# Patient Record
Sex: Female | Born: 1985 | Race: Black or African American | Hispanic: No | Marital: Single | Smoking: Never smoker
Health system: Southern US, Community
[De-identification: ages and names within clinical notes are randomized; demographics above are authoritative.]

---

## 2006-02-17 ENCOUNTER — Other Ambulatory Visit: Admission: RE | Admit: 2006-02-17 | Discharge: 2006-02-17 | Payer: Self-pay | Admitting: Obstetrics and Gynecology

## 2006-06-02 ENCOUNTER — Inpatient Hospital Stay (HOSPITAL_COMMUNITY): Admission: AD | Admit: 2006-06-02 | Discharge: 2006-06-02 | Payer: Self-pay | Admitting: Obstetrics and Gynecology

## 2006-08-17 ENCOUNTER — Inpatient Hospital Stay (HOSPITAL_COMMUNITY): Admission: AD | Admit: 2006-08-17 | Discharge: 2006-08-19 | Payer: Self-pay | Admitting: Obstetrics and Gynecology

## 2008-05-30 ENCOUNTER — Inpatient Hospital Stay (HOSPITAL_COMMUNITY): Admission: AD | Admit: 2008-05-30 | Discharge: 2008-05-30 | Payer: Self-pay | Admitting: Obstetrics

## 2008-07-19 ENCOUNTER — Inpatient Hospital Stay (HOSPITAL_COMMUNITY): Admission: AD | Admit: 2008-07-19 | Discharge: 2008-07-21 | Payer: Self-pay | Admitting: Obstetrics

## 2008-07-28 ENCOUNTER — Inpatient Hospital Stay (HOSPITAL_COMMUNITY): Admission: AD | Admit: 2008-07-28 | Discharge: 2008-07-31 | Payer: Self-pay | Admitting: Obstetrics

## 2008-07-28 ENCOUNTER — Encounter (INDEPENDENT_AMBULATORY_CARE_PROVIDER_SITE_OTHER): Payer: Self-pay | Admitting: Obstetrics

## 2010-08-01 ENCOUNTER — Ambulatory Visit (HOSPITAL_COMMUNITY)
Admission: AD | Admit: 2010-08-01 | Discharge: 2010-08-01 | Payer: Self-pay | Source: Home / Self Care | Attending: Obstetrics | Admitting: Obstetrics

## 2010-08-01 ENCOUNTER — Emergency Department (HOSPITAL_COMMUNITY)
Admission: EM | Admit: 2010-08-01 | Discharge: 2010-08-01 | Payer: Self-pay | Source: Home / Self Care | Admitting: Family Medicine

## 2010-08-06 LAB — RH IMMUNE GLOBULIN WORKUP (NOT WOMEN'S HOSP)
ABO/RH(D): B NEG
Antibody Screen: NEGATIVE
Unit division: 0

## 2010-09-23 ENCOUNTER — Inpatient Hospital Stay (HOSPITAL_COMMUNITY)
Admission: AD | Admit: 2010-09-23 | Discharge: 2010-09-24 | Disposition: A | Discharge status: Home / Self Care | Payer: Medicaid Other | Source: Ambulatory Visit | Attending: Obstetrics | Admitting: Obstetrics

## 2010-09-23 ENCOUNTER — Inpatient Hospital Stay (HOSPITAL_COMMUNITY)
Admission: RE | Admit: 2010-09-23 | Discharge: 2010-09-23 | Disposition: A | Discharge status: Home / Self Care | Payer: Medicaid Other | Source: Ambulatory Visit | Attending: Obstetrics | Admitting: Obstetrics

## 2010-09-23 DIAGNOSIS — O4700 False labor before 37 completed weeks of gestation, unspecified trimester: Secondary | ICD-10-CM

## 2010-10-11 ENCOUNTER — Inpatient Hospital Stay (HOSPITAL_COMMUNITY): Payer: Medicaid Other

## 2010-10-11 ENCOUNTER — Inpatient Hospital Stay (HOSPITAL_COMMUNITY)
Admission: AD | Admit: 2010-10-11 | Discharge: 2010-10-14 | DRG: 766 | Disposition: A | Discharge status: Home / Self Care | Payer: Medicaid Other | Source: Ambulatory Visit | Attending: Obstetrics | Admitting: Obstetrics

## 2010-10-11 DIAGNOSIS — Z22330 Carrier of Group B streptococcus: Secondary | ICD-10-CM

## 2010-10-11 DIAGNOSIS — O4290 Premature rupture of membranes, unspecified as to length of time between rupture and onset of labor, unspecified weeks of gestation: Secondary | ICD-10-CM | POA: Diagnosis present

## 2010-10-11 DIAGNOSIS — O99892 Other specified diseases and conditions complicating childbirth: Secondary | ICD-10-CM | POA: Diagnosis present

## 2010-10-11 DIAGNOSIS — O321XX0 Maternal care for breech presentation, not applicable or unspecified: Principal | ICD-10-CM | POA: Diagnosis present

## 2010-10-11 LAB — CBC
HCT: 34.8 % — ABNORMAL LOW (ref 36.0–46.0)
Hemoglobin: 11.7 g/dL — ABNORMAL LOW (ref 12.0–15.0)
MCH: 28.8 pg (ref 26.0–34.0)
MCHC: 33.6 g/dL (ref 30.0–36.0)
MCV: 85.7 fL (ref 78.0–100.0)
Platelets: 208 10*3/uL (ref 150–400)
RBC: 4.06 MIL/uL (ref 3.87–5.11)
RDW: 13 % (ref 11.5–15.5)
WBC: 8.2 10*3/uL (ref 4.0–10.5)

## 2010-10-11 LAB — RPR: RPR Ser Ql: NONREACTIVE

## 2010-10-12 LAB — CBC
HCT: 24.4 % — ABNORMAL LOW (ref 36.0–46.0)
Hemoglobin: 8.2 g/dL — ABNORMAL LOW (ref 12.0–15.0)
MCH: 29.2 pg (ref 26.0–34.0)
MCHC: 33.6 g/dL (ref 30.0–36.0)
MCV: 86.8 fL (ref 78.0–100.0)
Platelets: 163 10*3/uL (ref 150–400)
RBC: 2.81 MIL/uL — ABNORMAL LOW (ref 3.87–5.11)
RDW: 12.9 % (ref 11.5–15.5)
WBC: 9 10*3/uL (ref 4.0–10.5)

## 2010-10-13 LAB — RH IMMUNE GLOB WKUP(>/=20WKS)(NOT WOMEN'S HOSP)
Fetal Screen: NEGATIVE
Unit division: 0

## 2010-10-17 NOTE — H&P (Signed)
  NAMEMISTI, TOWLE              ACCOUNT NO.:  [id number]  MEDICAL RECORD NO.:  [id number]           PATIENT TYPE:  I  LOCATION:  9102                          [facility]  PHYSICIAN:  Kathreen Cosier, M.D.DATE OF BIRTH:  1986-07-04  DATE OF ADMISSION:  10/11/2010 DATE OF DISCHARGE:                             HISTORY & PHYSICAL   The patient is a 25 year old gravida 3, para 1-1-0-2, [facility] October 19, 2010.  Membranes ruptured at 1 a.m. today and came in labor.  By 6:30 a.m., she was 7-8 cm, presenting part -2 to 3 station.  Ultrasound was obtained for presentation and confirmed frank breech, so the patient was delivered by C-section.  PHYSICAL EXAMINATION:  GENERAL:  Revealed a well-developed female, in no distress. HEENT: Negative. LUNGS:  Clear. HEART:  Regular rhythm.  No murmurs or gallops. BREASTS:  No masses. ABDOMEN:  Term size uterus, breech presentation.  Fetal heart rate 140. EXTREMITIES:  Negative.          ______________________________ Kathreen Cosier, M.D.     BAM/MEDQ  D:  10/11/2010  T:  10/12/2010  Job:  [id number]  Electronically Signed by Francoise Ceo M.D. on 10/17/2010 07:29:45 AM

## 2010-10-17 NOTE — Op Note (Signed)
  [name], Carolyn House              ACCOUNT NO.:  [id number]  MEDICAL RECORD NO.:  [id number]           PATIENT TYPE:  I  LOCATION:  9102                          [facility]  PHYSICIAN:  Kathreen Cosier, M.D.DATE OF BIRTH:  Dec 04, 1985  DATE OF PROCEDURE:  10/11/2010 DATE OF DISCHARGE:                              OPERATIVE REPORT   PREOPERATIVE DIAGNOSES:  Intrauterine pregnancy at term, frank breech presentation.  POSTOPERATIVE DIAGNOSES:  Intrauterine pregnancy at term, frank breech presentation.  ANESTHESIA:  Epidural.  SURGEON:  Kathreen Cosier, MD  PROCEDURE:  The patient was placed in the operating table in supine position.  Abdomen was prepped and draped, and the bladder emptied with a Foley catheter.  Transverse suprapubic incision was made, carried down through the rectus fascia.  Fascia cleaned and incised at the length of incision.  The recti muscles were retracted laterally.  Peritoneum was incised longitudinally.  Transverse incision was made in the visceral peritoneum above the bladder.  Bladder mobilized inferiorly.  Transverse lower uterine incision made.  The patient delivered a frank breech female Apgar 8 and 9, weighing 6 pounds 6 ounces.  Placenta was anterior, removed manually and sent to labor and delivery.  Uterine cavity cleaned with dry laps.  The uterine incision closed in 2 layers with continuous suture of #1 chromic.  Bladder flap reattached with 2-0 chromic.  Uterus well contracted.  Tubes and ovaries are normal. Abdomen was closed in layers, peritoneum continuous suture of 0 chromic, fascia continuous suture with Dexon.  Skin closed with subcuticular stitch of 4-0 Monocryl.  The blood loss was 600 mL.  The patient was taken to recovery room in good condition.          ______________________________ Kathreen Cosier, M.D.     BAM/MEDQ  D:  10/11/2010  T:  10/12/2010  Job:  [id number]  Electronically Signed by Francoise Ceo  M.D. on 10/17/2010 07:29:48 AM

## 2010-10-25 NOTE — Discharge Summary (Signed)
  NAMECYAN, CLIPPINGER              ACCOUNT NO.:  [id number]  MEDICAL RECORD NO.:  [id number]           PATIENT TYPE:  I  LOCATION:  9102                          [facility]  PHYSICIAN:  Venia Riveron A. Clearance Coots, M.D.DATE OF BIRTH:  July 25, 1985  DATE OF ADMISSION:  10/11/2010 DATE OF DISCHARGE:  10/14/2010                              DISCHARGE SUMMARY   ADMITTING DIAGNOSES:  Term pregnancy, spontaneous rupture of membranes, active labor, and breech presentation.  DISCHARGE DIAGNOSES:  Term pregnancy, spontaneous rupture of membranes, active labor, and breech presentation, status post primary low transverse cesarean section on October 11, 2010.  Viable female delivered at 12:51, Apgars of 8 at 1 minute and 9 at 5 minutes, weight of 2900 g, length of 48.26 cm.  Mother and infant discharged home in good condition.  REASON FOR ADMISSION:  This is a 25 year old, gravida 3, para 2, estimated date of confinement of October 19, 2010, who presented with rupture of membranes at 1300 and active labor.  On examination, during the course of labor, she was noted to be 7-8 cm dilated and the presenting part at -3 and -2 station and the presenting part could not be determined with certainty on examination.  Ultrasound was obtained for presentation and revealed frank breech presentation.  The patient was therefore taken for cesarean section delivery.  PAST MEDICAL HISTORY:  Please see chart for full historical data.  PHYSICAL EXAMINATION:  GENERAL:  A well-nourished, well-developed female, in no acute distress. VITAL SIGNS:  She is afebrile.  Vital Signs are stable. LUNGS:  Clear to auscultation bilaterally. HEART:  Regular rate and rhythm. ABDOMEN:  Gravid, nontender.  Cervix 6 cm, dilated, 100% effaced, and - 3, -2 station, suspected breech presentation.  ADMITTING LABORATORY DATA:  Hemoglobin 11.7, hematocrit 34.8, white blood cell count 8200, and platelets 208,000.  RPR is  nonreactive.  HOSPITAL COURSE:  The patient was admitted and during the course of labor, the presentation could not be determined with certainty and ultrasound was obtained which revealed a frank breech presentation.  The patient was taken for cesarean section.  Primary low transverse cesarean section was performed on October 11, 2010.  There were no intraoperative complications.  Postoperative course was uncomplicated.  The patient was discharged home on postop day #3 in good condition.  The patient did have anemia postoperatively, but was clinically stable and was able to ambulate without dizziness or headaches or orthostatic blood pressure changes.  DISCHARGE LABORATORY DATA:  Hemoglobin 8.2, hematocrit 24.4, white blood cell count 9000, and platelets 163,000.  DISCHARGE DISPOSITION:  Medications:  Continue prenatal vitamins.  Iron was prescribed for anemia.  Percocet and ibuprofen was prescribed for pain.  Micronor was prescribed for contraception.  Routine written instructions were given for discharge after cesarean section.  The patient is to call Dr. Elsie Stain office for a followup appointment in 2 weeks.     Shreyas Piatkowski A. Clearance Coots, M.D.     CAH/MEDQ  D:  10/14/2010  T:  10/15/2010  Job:  [id number]  Electronically Signed by Coral Ceo M.D. on 10/25/2010 11:18:42 AM

## 2010-11-05 LAB — URINALYSIS, ROUTINE W REFLEX MICROSCOPIC
Bilirubin Urine: NEGATIVE
Glucose, UA: NEGATIVE mg/dL
Hgb urine dipstick: NEGATIVE
Ketones, ur: NEGATIVE mg/dL
Nitrite: NEGATIVE
Protein, ur: NEGATIVE mg/dL
Specific Gravity, Urine: 1.015 (ref 1.005–1.030)
Urobilinogen, UA: 0.2 mg/dL (ref 0.0–1.0)
pH: 6.5 (ref 5.0–8.0)

## 2010-11-05 LAB — CBC
HCT: 38.9 % (ref 36.0–46.0)
HCT: 39.3 % (ref 36.0–46.0)
Hemoglobin: 13.1 g/dL (ref 12.0–15.0)
Hemoglobin: 13.1 g/dL (ref 12.0–15.0)
MCHC: 33.3 g/dL (ref 30.0–36.0)
MCHC: 33.6 g/dL (ref 30.0–36.0)
MCV: 92.9 fL (ref 78.0–100.0)
MCV: 94.3 fL (ref 78.0–100.0)
Platelets: 166 10*3/uL (ref 150–400)
Platelets: 201 10*3/uL (ref 150–400)
RBC: 4.17 MIL/uL (ref 3.87–5.11)
RBC: 4.19 MIL/uL (ref 3.87–5.11)
RDW: 13.6 % (ref 11.5–15.5)
RDW: 13.7 % (ref 11.5–15.5)
WBC: 10.5 10*3/uL (ref 4.0–10.5)
WBC: 7.6 10*3/uL (ref 4.0–10.5)

## 2010-11-05 LAB — LACTATE DEHYDROGENASE
LDH: 167 U/L (ref 94–250)
LDH: 279 U/L — ABNORMAL HIGH (ref 94–250)

## 2010-11-05 LAB — COMPREHENSIVE METABOLIC PANEL
ALT: 15 U/L (ref 0–35)
ALT: 37 U/L — ABNORMAL HIGH (ref 0–35)
AST: 27 U/L (ref 0–37)
AST: 61 U/L — ABNORMAL HIGH (ref 0–37)
Albumin: 2.5 g/dL — ABNORMAL LOW (ref 3.5–5.2)
Albumin: 2.9 g/dL — ABNORMAL LOW (ref 3.5–5.2)
Alkaline Phosphatase: 111 U/L (ref 39–117)
Alkaline Phosphatase: 130 U/L — ABNORMAL HIGH (ref 39–117)
BUN: 4 mg/dL — ABNORMAL LOW (ref 6–23)
BUN: 7 mg/dL (ref 6–23)
CO2: 23 mEq/L (ref 19–32)
CO2: 23 mEq/L (ref 19–32)
Calcium: 7 mg/dL — ABNORMAL LOW (ref 8.4–10.5)
Calcium: 8.9 mg/dL (ref 8.4–10.5)
Chloride: 102 mEq/L (ref 96–112)
Chloride: 99 mEq/L (ref 96–112)
Creatinine, Ser: 0.49 mg/dL (ref 0.4–1.2)
Creatinine, Ser: 0.53 mg/dL (ref 0.4–1.2)
GFR calc Af Amer: >60 mL/min (ref >60)
GFR calc Af Amer: >60 mL/min (ref >60)
GFR calc non Af Amer: >60 mL/min (ref >60)
GFR calc non Af Amer: >60 mL/min (ref >60)
Glucose, Bld: 118 mg/dL — ABNORMAL HIGH (ref 70–99)
Glucose, Bld: 99 mg/dL (ref 70–99)
Potassium: 3.6 mEq/L (ref 3.5–5.1)
Potassium: 4.2 mEq/L (ref 3.5–5.1)
Sodium: 130 mEq/L — ABNORMAL LOW (ref 135–145)
Sodium: 135 mEq/L (ref 135–145)
Total Bilirubin: 0.5 mg/dL (ref 0.3–1.2)
Total Bilirubin: 0.6 mg/dL (ref 0.3–1.2)
Total Protein: 5.3 g/dL — ABNORMAL LOW (ref 6.0–8.3)
Total Protein: 6.1 g/dL (ref 6.0–8.3)

## 2010-11-05 LAB — RH IMMUNE GLOB WKUP(>/=20WKS)(NOT WOMEN'S HOSP): Fetal Screen: NEGATIVE

## 2010-11-05 LAB — URIC ACID
Uric Acid, Serum: 4.9 mg/dL (ref 2.4–7.0)
Uric Acid, Serum: 5.3 mg/dL (ref 2.4–7.0)

## 2010-11-05 LAB — RPR: RPR Ser Ql: NONREACTIVE

## 2010-11-05 LAB — URINE MICROSCOPIC-ADD ON

## 2010-12-07 NOTE — H&P (Signed)
NAMEDENEISE, GETTY              ACCOUNT NO.:  [id number]   MEDICAL RECORD NO.:  [id number]          PATIENT TYPE:  INP   LOCATION:  9169                          [facility]   PHYSICIAN:  Crist Fat. Rivard, M.D. DATE OF BIRTH:  22-Dec-1985   DATE OF ADMISSION:  DATE OF DISCHARGE:                              HISTORY & PHYSICAL   Carolyn House is a 25 year old gravida 1, para 0, at 38-1/7 weeks, who  presented with uterine contractions every 4 minutes for several hours.  The patient has noted contractions all night without any sleep.  She  denies bleeding but has had increased wetness over the last 24 hours  more since last night.  She reports positive fetal movement.  She denies  headache, visual symptoms, or epigastric pain.   Pregnancy is remarkable for:  1. Age 6.  2. Rh negative with patient receiving RhoGAM during her pregnancy.  3. Family history of Down's syndrome.   PRENATAL LABS:  Blood type is B negative, Rh antibody negative, VDRL  nonreactive, rubella titer positive, hepatitis B surface antigen  negative, HIV nonreactive.  Sickle cell test was negative.  GC and  Chlamydia cultures were negative in the first trimester and the third  trimester.  Pap was normal.  Quadruple screen was normal.  Hemoglobin  upon entering into practice was 12.1.  It was 11.2 at 27 weeks.  Glucola  was normal.  Cystic fibrosis testing was negative.  Group B strep  culture was negative at 36 weeks.  EDC of 08/26/2006 was established by  last menstrual period and was in agreement with ultrasound at  approximately 18 weeks.   HISTORY OF PRESENT PREGNANCY:  The patient entered care at approximately  12 weeks.  She had initially received care at the [facility]  Department but then had transferred to [facility] at approximately  12 weeks.  She had been treated for a UTI at approximately 11 weeks at  the Health Department with Macrobid.  The patient had an ultrasound at  19 weeks  showing normal growth, normal anatomy, and cervix was 3.03 cm  long.  She had her Glucola at 27 weeks, which was normal.  Her  hemoglobin was also normal at that time.  She was given a prescription  for Rhophylac, and she did have that in maternity admission subsequent  to that visit.  Cystic fibrosis testing was also done with her Glucola  and was normal.  The rest of the patient's pregnancy progressed without  difficulty.  She had negative GC and Chlamydia and group B strep at 36  weeks.   OBSTETRICAL HISTORY:  The patient is a primigravida.   MEDICAL HISTORY:  She reports the usual childhood illnesses.  She was a  previous condom user.  She has had one UTI in the past.   ALLERGIES:  She has no known medical allergies.   FAMILY HISTORY:  Her maternal grandfather has hypertension.  Her mother  has asthma.  Her father is diabetic on insulin, and her maternal  grandfather is diabetic on insulin.   GENETIC HISTORY:  Remarkable  for maternal cousin who has Down's  syndrome.   SOCIAL HISTORY:  The patient is single.  The father of the baby is  involved and supportive.  His name is Lodema Pilot.  The patient is  [place] [employer].  She has a high school education.  She is employed in Personnel officer.  Her partner also has a high school  education.  He is employed with a moving company.  She has been followed  by the [facility] [place], [place].  She denies  any alcohol, drug, or tobacco use during this pregnancy.   PHYSICAL EXAMINATION:  Blood pressures in maternal admission were  ranging from 129-161 systolic to 95-123 diastolic.  Other vital signs  were stable.  HEENT is within normal limits.  Lungs:  Breath sounds are  clear.  Heart regular rate and rhythm without murmur.  Breasts are soft  and nontender.  Abdomen:  Fundal height is approximately 38 cm.  Estimated fetal weight 7 to 7-1/2 pounds.  Uterine contractions every 3  to 3-1/2  minutes 60 seconds in duration, moderate to strong quality.  Fetal heart rate is reactive with a negative spontaneous CST.  The  patient is noted to be leaking clear fluid, which is positive for fern  testing.  The cervix was 6-7, 100%, vertex -1 -2 station with 4 waters  noted.  Deep tendon reflexes are 2+ without clonus.  There is a trace  edema noted.  The patient's weight today is 148 pounds.   IMPRESSION:  1. Intrauterine pregnancy at 38-1/7 weeks.  2. Active labor.  3. Negative group B strep.  4. Questionable prolonged rupture of membranes with no evidence of      chorioamnionitis.  5. Elevated blood pressure without prior history.   PLAN:  1. Admit to birthing suite pronto with Dr. Estanislado Pandy as attending      physician.  2. Routine certified nurse midwife orders.  3. Will check PIH, labs, and a cath UA.  4. Plan epidural per patient request.  5. Close observation of blood pressure and fetal heart status and      maternal status.      Carolyn House, C.N.M.      Crist Fat Rivard, M.D.  Electronically Signed    VLL/MEDQ  D:  08/17/2006  T:  08/17/2006  Job:  [id number]

## 2011-04-23 LAB — RH IMMUNE GLOBULIN WORKUP (NOT WOMEN'S HOSP)
ABO/RH(D): B NEG
Antibody Screen: NEGATIVE

## 2011-04-26 LAB — COMPREHENSIVE METABOLIC PANEL
ALT: 12 U/L (ref 0–35)
ALT: 13 U/L (ref 0–35)
AST: 21 U/L (ref 0–37)
Albumin: 2.9 g/dL — ABNORMAL LOW (ref 3.5–5.2)
Alkaline Phosphatase: 114 U/L (ref 39–117)
Alkaline Phosphatase: 128 U/L — ABNORMAL HIGH (ref 39–117)
BUN: 7 mg/dL (ref 6–23)
BUN: 8 mg/dL (ref 6–23)
CO2: 21 mEq/L (ref 19–32)
CO2: 23 mEq/L (ref 19–32)
Calcium: 8.8 mg/dL (ref 8.4–10.5)
Chloride: 104 mEq/L (ref 96–112)
Creatinine, Ser: 0.65 mg/dL (ref 0.4–1.2)
GFR calc Af Amer: >60 mL/min (ref >60)
GFR calc non Af Amer: >60 mL/min (ref >60)
GFR calc non Af Amer: >60 mL/min (ref >60)
Glucose, Bld: 104 mg/dL — ABNORMAL HIGH (ref 70–99)
Glucose, Bld: 92 mg/dL (ref 70–99)
Potassium: 3.5 mEq/L (ref 3.5–5.1)
Potassium: 3.8 mEq/L (ref 3.5–5.1)
Sodium: 133 mEq/L — ABNORMAL LOW (ref 135–145)
Sodium: 136 mEq/L (ref 135–145)
Total Bilirubin: 0.4 mg/dL (ref 0.3–1.2)
Total Bilirubin: 0.6 mg/dL (ref 0.3–1.2)
Total Protein: 5.8 g/dL — ABNORMAL LOW (ref 6.0–8.3)
Total Protein: 6.2 g/dL (ref 6.0–8.3)

## 2011-04-26 LAB — CBC
HCT: 36.9 % (ref 36.0–46.0)
HCT: 38.4 % (ref 36.0–46.0)
Hemoglobin: 12.6 g/dL (ref 12.0–15.0)
Hemoglobin: 13.1 g/dL (ref 12.0–15.0)
MCHC: 34 g/dL (ref 30.0–36.0)
MCHC: 34.2 g/dL (ref 30.0–36.0)
MCV: 93 fL (ref 78.0–100.0)
Platelets: 174 10*3/uL (ref 150–400)
RBC: 3.96 MIL/uL (ref 3.87–5.11)
RBC: 4.13 MIL/uL (ref 3.87–5.11)
RDW: 13.4 % (ref 11.5–15.5)
RDW: 13.5 % (ref 11.5–15.5)
WBC: 7.9 10*3/uL (ref 4.0–10.5)

## 2011-04-26 LAB — CREATININE CLEARANCE, URINE, 24 HOUR
Collection Interval-CRCL: 24 hours
Creatinine Clearance: 140 mL/min — ABNORMAL HIGH (ref 75–115)
Creatinine, 24H Ur: 1307 mg/d (ref 700–1800)
Creatinine, Urine: 186.7 mg/dL
Creatinine: 0.65 mg/dL (ref 0.40–1.20)
Urine Total Volume-CRCL: 700 mL

## 2011-04-26 LAB — PROTEIN, URINE, 24 HOUR
Collection Interval-UPROT: 24 hours
Protein, 24H Urine: 196 mg/d — ABNORMAL HIGH (ref 50–100)
Protein, Urine: 28 mg/dL
Urine Total Volume-UPROT: 700 mL

## 2011-04-26 LAB — LACTATE DEHYDROGENASE
LDH: 120 U/L (ref 94–250)
LDH: 151 U/L (ref 94–250)

## 2011-04-26 LAB — URIC ACID
Uric Acid, Serum: 4.3 mg/dL (ref 2.4–7.0)
Uric Acid, Serum: 4.7 mg/dL (ref 2.4–7.0)

## 2011-10-19 ENCOUNTER — Other Ambulatory Visit: Payer: Self-pay | Admitting: Obstetrics

## 2014-12-14 ENCOUNTER — Encounter (HOSPITAL_COMMUNITY): Payer: Self-pay | Admitting: Emergency Medicine

## 2014-12-14 ENCOUNTER — Emergency Department (INDEPENDENT_AMBULATORY_CARE_PROVIDER_SITE_OTHER)
Admission: EM | Admit: 2014-12-14 | Discharge: 2014-12-14 | Disposition: A | Discharge status: Home / Self Care | Payer: Self-pay | Source: Home / Self Care | Attending: Family Medicine | Admitting: Family Medicine

## 2014-12-14 DIAGNOSIS — S29012A Strain of muscle and tendon of back wall of thorax, initial encounter: Secondary | ICD-10-CM

## 2014-12-14 DIAGNOSIS — J069 Acute upper respiratory infection, unspecified: Secondary | ICD-10-CM

## 2014-12-14 LAB — POCT RAPID STREP A: Streptococcus, Group A Screen (Direct): NEGATIVE

## 2014-12-14 MED ORDER — CYCLOBENZAPRINE HCL 5 MG PO TABS: 5.0000 mg | ORAL_TABLET | Freq: Every evening | ORAL | Status: AC | PRN

## 2014-12-14 MED ORDER — NAPROXEN 375 MG PO TABS: 375.0000 mg | ORAL_TABLET | Freq: Two times a day (BID) | ORAL | Status: AC

## 2014-12-14 NOTE — ED Notes (Addendum)
C/o cold sx States she has a cough, sore throat and chills which started yesterday Ibuprofen was used as tx States she has lower back pain Does sit down at work Tylenol used as tx Denies any injury

## 2014-12-14 NOTE — Discharge Instructions (Signed)
Thank you for coming in today. Call or go to the emergency room if you get worse, have trouble breathing, have chest pains, or palpitations.  Come back or go to the emergency room if you notice new weakness new numbness problems walking or bowel or bladder problems.   Back Exercises These exercises may help you when beginning to rehabilitate your injury. Your symptoms may resolve with or without further involvement from your physician, physical therapist or athletic trainer. While completing these exercises, remember:   Restoring tissue flexibility helps normal motion to return to the joints. This allows healthier, less painful movement and activity.  An effective stretch should be held for at least 30 seconds.  A stretch should never be painful. You should only feel a gentle lengthening or release in the stretched tissue. STRETCH - Extension, Prone on Elbows   Lie on your stomach on the floor, a bed will be too soft. Place your palms about shoulder width apart and at the height of your head.  Place your elbows under your shoulders. If this is too painful, stack pillows under your chest.  Allow your body to relax so that your hips drop lower and make contact more completely with the floor.  Hold this position for __________ seconds.  Slowly return to lying flat on the floor. Repeat __________ times. Complete this exercise __________ times per day.  RANGE OF MOTION - Extension, Prone Press Ups   Lie on your stomach on the floor, a bed will be too soft. Place your palms about shoulder width apart and at the height of your head.  Keeping your back as relaxed as possible, slowly straighten your elbows while keeping your hips on the floor. You may adjust the placement of your hands to maximize your comfort. As you gain motion, your hands will come more underneath your shoulders.  Hold this position __________ seconds.  Slowly return to lying flat on the floor. Repeat __________ times.  Complete this exercise __________ times per day.  RANGE OF MOTION- Quadruped, Neutral Spine   Assume a hands and knees position on a firm surface. Keep your hands under your shoulders and your knees under your hips. You may place padding under your knees for comfort.  Drop your head and point your tail bone toward the ground below you. This will round out your low back like an angry cat. Hold this position for __________ seconds.  Slowly lift your head and release your tail bone so that your back sags into a large arch, like an old horse.  Hold this position for __________ seconds.  Repeat this until you feel limber in your low back.  Now, find your "sweet spot." This will be the most comfortable position somewhere between the two previous positions. This is your neutral spine. Once you have found this position, tense your stomach muscles to support your low back.  Hold this position for __________ seconds. Repeat __________ times. Complete this exercise __________ times per day.  STRETCH - Flexion, Single Knee to Chest   Lie on a firm bed or floor with both legs extended in front of you.  Keeping one leg in contact with the floor, bring your opposite knee to your chest. Hold your leg in place by either grabbing behind your thigh or at your knee.  Pull until you feel a gentle stretch in your low back. Hold __________ seconds.  Slowly release your grasp and repeat the exercise with the opposite side. Repeat __________ times. Complete this  exercise __________ times per day.  STRETCH - Hamstrings, Standing  Stand or sit and extend your right / left leg, placing your foot on a chair or foot stool  Keeping a slight arch in your low back and your hips straight forward.  Lead with your chest and lean forward at the waist until you feel a gentle stretch in the back of your right / left knee or thigh. (When done correctly, this exercise requires leaning only a small distance.)  Hold this  position for __________ seconds. Repeat __________ times. Complete this stretch __________ times per day. STRENGTHENING - Deep Abdominals, Pelvic Tilt   Lie on a firm bed or floor. Keeping your legs in front of you, bend your knees so they are both pointed toward the ceiling and your feet are flat on the floor.  Tense your lower abdominal muscles to press your low back into the floor. This motion will rotate your pelvis so that your tail bone is scooping upwards rather than pointing at your feet or into the floor.  With a gentle tension and even breathing, hold this position for __________ seconds. Repeat __________ times. Complete this exercise __________ times per day.  STRENGTHENING - Abdominals, Crunches   Lie on a firm bed or floor. Keeping your legs in front of you, bend your knees so they are both pointed toward the ceiling and your feet are flat on the floor. Cross your arms over your chest.  Slightly tip your chin down without bending your neck.  Tense your abdominals and slowly lift your trunk high enough to just clear your shoulder blades. Lifting higher can put excessive stress on the low back and does not further strengthen your abdominal muscles.  Control your return to the starting position. Repeat __________ times. Complete this exercise __________ times per day.  STRENGTHENING - Quadruped, Opposite UE/LE Lift   Assume a hands and knees position on a firm surface. Keep your hands under your shoulders and your knees under your hips. You may place padding under your knees for comfort.  Find your neutral spine and gently tense your abdominal muscles so that you can maintain this position. Your shoulders and hips should form a rectangle that is parallel with the floor and is not twisted.  Keeping your trunk steady, lift your right hand no higher than your shoulder and then your left leg no higher than your hip. Make sure you are not holding your breath. Hold this position  __________ seconds.  Continuing to keep your abdominal muscles tense and your back steady, slowly return to your starting position. Repeat with the opposite arm and leg. Repeat __________ times. Complete this exercise __________ times per day. Document Released: 07/26/2005 Document Revised: 09/30/2011 Document Reviewed: 10/20/2008 [facility] Patient Information 2015 [place], [place]. This information is not intended to replace advice given to you by your health care provider. Make sure you discuss any questions you have with your health care provider.

## 2014-12-14 NOTE — ED Provider Notes (Signed)
Carolyn House is a 29 y.o. female who presents to Urgent Care today for chills body ache congestion starting yesterday. Patient also notes 2 weeks of thoracic back pain. Pain is worse with arm activity. No radiating pain weakness or numbness fevers or chills. He is tried ibuprofen and Tylenol which help a little.   History reviewed. No pertinent past medical history. No past surgical history on file. History  Substance Use Topics  . Smoking status: Not on file  . Smokeless tobacco: Not on file  . Alcohol Use: Not on file   ROS as above Medications: No current facility-administered medications for this encounter.   Current Outpatient Prescriptions  Medication Sig Dispense Refill  . cyclobenzaprine (FLEXERIL) 5 MG tablet Take 1 tablet (5 mg total) by mouth at bedtime as needed for muscle spasms. 20 tablet 0  . naproxen (NAPROSYN) 375 MG tablet Take 1 tablet (375 mg total) by mouth 2 (two) times daily. 20 tablet 0   No Known Allergies   Exam:  BP 132/92 mmHg  Pulse 91  Temp(Src) 98.3 F (36.8 C) (Oral)  Resp 16  SpO2 100%  LMP 12/07/2014 Gen: Well NAD HEENT: EOMI,  MMM clear nasal discharge normal posterior pharynx and tympanic membranes Lungs: Normal work of breathing. CTABL Heart: RRR no MRG Abd: NABS, Soft. Nondistended, Nontender Exts: Brisk capillary refill, warm and well perfused.  Back: Nontender to spinal midline. Tender palpation right rhomboid. Pain with scapular motion of the right. Upper extremity strength is intact and equal bilaterally.  Results for orders placed or performed during the hospital encounter of 12/14/14 (from the past 24 hour(s))  POCT rapid strep A [facility])     Status: None   Collection Time: 12/14/14 12:34 PM  Result Value Ref Range   Streptococcus, Group A Screen (Direct) NEGATIVE NEGATIVE   No results found.  Assessment and Plan: 29 y.o. female with viral URI and rhomboid strain. Treat with Flexeril and naproxen. Follow up with  sports medicine as needed. Work note provided. .  Discussed warning signs or symptoms. Please see discharge instructions. Patient expresses understanding.     Rodolph BongEvan S Arielis Leonhart, MD 12/14/14 1245

## 2014-12-16 LAB — CULTURE, GROUP A STREP: STREP A CULTURE: NEGATIVE

## 2015-05-11 ENCOUNTER — Emergency Department (HOSPITAL_BASED_OUTPATIENT_CLINIC_OR_DEPARTMENT_OTHER)
Admission: EM | Admit: 2015-05-11 | Discharge: 2015-05-11 | Disposition: A | Discharge status: Home / Self Care | Payer: Medicaid Other | Attending: Emergency Medicine | Admitting: Emergency Medicine

## 2015-05-11 ENCOUNTER — Encounter (HOSPITAL_BASED_OUTPATIENT_CLINIC_OR_DEPARTMENT_OTHER): Payer: Self-pay | Admitting: *Deleted

## 2015-05-11 DIAGNOSIS — Z791 Long term (current) use of non-steroidal anti-inflammatories (NSAID): Secondary | ICD-10-CM | POA: Insufficient documentation

## 2015-05-11 DIAGNOSIS — G8929 Other chronic pain: Secondary | ICD-10-CM

## 2015-05-11 DIAGNOSIS — R1031 Right lower quadrant pain: Secondary | ICD-10-CM | POA: Insufficient documentation

## 2015-05-11 DIAGNOSIS — Z9889 Other specified postprocedural states: Secondary | ICD-10-CM | POA: Insufficient documentation

## 2015-05-11 DIAGNOSIS — Z3202 Encounter for pregnancy test, result negative: Secondary | ICD-10-CM | POA: Insufficient documentation

## 2015-05-11 LAB — CBC WITH DIFFERENTIAL/PLATELET
BASOS PCT: 1 %
Basophils Absolute: 0 10*3/uL (ref 0.0–0.1)
EOS ABS: 0.1 10*3/uL (ref 0.0–0.7)
Eosinophils Relative: 1 %
HCT: 38.2 % (ref 36.0–46.0)
HEMOGLOBIN: 12.8 g/dL (ref 12.0–15.0)
Lymphocytes Relative: 42 %
Lymphs Abs: 3 10*3/uL (ref 0.7–4.0)
MCH: 29 pg (ref 26.0–34.0)
MCHC: 33.5 g/dL (ref 30.0–36.0)
MCV: 86.6 fL (ref 78.0–100.0)
Monocytes Absolute: 0.5 10*3/uL (ref 0.1–1.0)
Monocytes Relative: 7 %
NEUTROS PCT: 49 %
Neutro Abs: 3.5 10*3/uL (ref 1.7–7.7)
Platelets: 310 10*3/uL (ref 150–400)
RBC: 4.41 MIL/uL (ref 3.87–5.11)
RDW: 12.9 % (ref 11.5–15.5)
WBC: 7.1 10*3/uL (ref 4.0–10.5)

## 2015-05-11 LAB — WET PREP, GENITAL
TRICH WET PREP: NONE SEEN
YEAST WET PREP: NONE SEEN

## 2015-05-11 LAB — URINALYSIS, ROUTINE W REFLEX MICROSCOPIC
Bilirubin Urine: NEGATIVE
Glucose, UA: NEGATIVE mg/dL
Hgb urine dipstick: NEGATIVE
KETONES UR: NEGATIVE mg/dL
LEUKOCYTES UA: NEGATIVE
NITRITE: NEGATIVE
PROTEIN: NEGATIVE mg/dL
Specific Gravity, Urine: 1.023 (ref 1.005–1.030)
UROBILINOGEN UA: 1 mg/dL (ref 0.0–1.0)
pH: 7 (ref 5.0–8.0)

## 2015-05-11 LAB — COMPREHENSIVE METABOLIC PANEL
ALBUMIN: 4.3 g/dL (ref 3.5–5.0)
ALK PHOS: 46 U/L (ref 38–126)
ALT: 10 U/L — AB (ref 14–54)
ANION GAP: 5 (ref 5–15)
AST: 18 U/L (ref 15–41)
BUN: 10 mg/dL (ref 6–20)
CALCIUM: 8.8 mg/dL — AB (ref 8.9–10.3)
CHLORIDE: 102 mmol/L (ref 101–111)
CO2: 27 mmol/L (ref 22–32)
CREATININE: 0.69 mg/dL (ref 0.44–1.00)
GFR calc Af Amer: >60 mL/min (ref >60)
GFR calc non Af Amer: >60 mL/min (ref >60)
GLUCOSE: 101 mg/dL — AB (ref 65–99)
Potassium: 3.7 mmol/L (ref 3.5–5.1)
SODIUM: 134 mmol/L — AB (ref 135–145)
Total Bilirubin: 0.7 mg/dL (ref 0.3–1.2)
Total Protein: 7.4 g/dL (ref 6.5–8.1)

## 2015-05-11 LAB — PREGNANCY, URINE: PREG TEST UR: NEGATIVE

## 2015-05-11 LAB — LIPASE, BLOOD: LIPASE: 30 U/L (ref 11–51)

## 2015-05-11 NOTE — Discharge Instructions (Signed)
For pain control please take ibuprofen (also known as Motrin or Advil) 800mg (this is normally 4 over the counter pills) 3 times a day  for 5 days. Take with food to minimize stomach irritation. ° °Do not hesitate to return to the emergency room for any new, worsening or concerning symptoms. ° °Please obtain primary care using resource guide below. Let them know that you were seen in the emergency room and that they will need to obtain records for further outpatient management. ° ° ° °Emergency Department Resource Guide °1) Find a Doctor and Pay Out of Pocket °Although you won't have to find out who is covered by your insurance plan, it is a good idea to ask around and get recommendations. You will then need to call the office and see if the doctor you have chosen will accept you as a new patient and what types of options they offer for patients who are self-pay. Some doctors offer discounts or will set up payment plans for their patients who do not have insurance, but you will need to ask so you aren't surprised when you get to your appointment. ° °2) Contact Your Local Health Department °Not all health departments have doctors that can see patients for sick visits, but many do, so it is worth a call to see if yours does. If you don't know where your local health department is, you can check in your phone book. The CDC also has a tool to help you locate your state's health department, and many state websites also have listings of all of their local health departments. ° °3) Find a Walk-in Clinic °If your illness is not likely to be very severe or complicated, you may want to try a walk in clinic. These are popping up all over the country in pharmacies, drugstores, and shopping centers. They're usually staffed by nurse practitioners or physician assistants that have been trained to treat common illnesses and complaints. They're usually fairly quick and inexpensive. However, if you have serious medical issues or  chronic medical problems, these are probably not your best option. ° °No Primary Care Doctor: °- Call [facility] at  832-8000 - they can help you locate a primary care doctor that  accepts your insurance, provides certain services, etc. °- Physician Referral Service- 1-800-533-3463 ° °Chronic Pain Problems: °Organization         Address  Phone   Notes  °[facility]  (336) 297-2271 Patients need to be referred by their primary care doctor.  ° °Medication Assistance: °Organization         Address  Phone   Notes  °[place] [facility] [address] °[place], [place] [address] (336) 641-8030 --Must be a resident of [place] °-- Must have NO insurance coverage whatsoever (no Medicaid/ Medicare, etc.) °-- The pt. MUST have a primary care doctor that directs their care regularly and follows them in the community °  °[facility]  (866) 331-1348   °[employer]  (888) 892-1162   ° °Agencies that provide inexpensive medical care: °Organization         Address  Phone   Notes  °[facility]  (336) 832-8035   °[facility]    (336) 832-7272   °[facility] [address] °[place], [place] [address] (336) 832-4777   °[facility] [address], °[place] (336) 271-4999   °[facility]    (336) 373-0678   °  [facility]    (336) 272-1050   °[facility] ° [address], [place] Phone:  (336) 832-4444, Fax:  (336) 832-4440 Hours of Operation:  9 am - 6 pm, M-F.  Also accepts Medicaid/Medicare and self-pay.  °[facility] ° [address], [place] Phone: (336) 832-3150, Fax: (336) 832-3151. Hours of Operation:  8:30 am - 5:30 pm, M-F.  Also accepts Medicaid and self-pay.  °[facility] [place] [address], [place] Phone: (336) 878-6027   °[facility] [address], [place], [place]  (336)723-1848, Ext. 123 Mondays & Thursdays: 7-9 AM.  First 15 patients are seen on a first come, first serve basis. °  ° °Medicaid-accepting [place] Providers: ° °Organization         Address  Phone   Notes  °[facility] [address], [place] (336) 641-2100 Also accepts self-pay patients.  °[facility] [address], [place] ° (336) 856-9996   °[facility] [address], [place] (336) 288-8857   °[facility] [address] [place] [address], [place] (336) 299-7000   °Veita Bland [address], [place]  ° (336) 373-1557 Only accepts [place] Access Medicaid patients after they have their name applied to their card.  ° °Self-Pay (no insurance) in [place]: ° °Organization         Address  Phone   Notes  °Sickle Cell Patients, [facility] [address], [place] (336) 832-1970   °[facility] [address], [place] (336) 832-4400   °[facility] ° [address], [place] (336) 992-4800   °[facility]/Dr. Osei-Bonsu ° [address] [place] [address], [place] or [address], [place] (336) 841-8500 Phone number for both [place] and [place] locations is the same.  °[facility] [address], [place] (336) 299-0000   °[facility] [address] [place] [address], [place] or [address] (336) 852-7530 °(336) 878-2260   °[facility] [address], [place] (336) 350-1642, phone; (336) 294-5005, fax Sees patients 1st and 3rd Saturday of every month.  Must not qualify for public or private insurance (i.e. Medicaid, Medicare, [place] Health Choice, Veterans' Benefits) • Household income should be no more than 200% of the poverty level •The clinic cannot treat you if you are pregnant or think you are pregnant • Sexually transmitted  diseases are not treated at the clinic.  ° ° °Dental Care: °Organization         Address  Phone  Notes  °[facility] [facility] [address],  (336) 641-6152 Accepts children up to age 21 who are enrolled in Medicaid or [place] Health Choice; pregnant women with a Medicaid card; and children who have applied for Medicaid or [place] Health Choice, but were declined, whose parents can pay a reduced fee at time of service.  °[facility] [place]  [address], [place] (336) 641-7733 Accepts children up to age 21 who are enrolled in Medicaid or [place] Health Choice; pregnant women with a Medicaid card; and children who have applied for Medicaid or [place] Health Choice, but were declined, whose parents can pay a reduced fee at time   of service.  °[facility] ° [address], [place] (336) 641-4533 Patients are seen by appointment only. Walk-ins are not accepted. [facility] will see patients 18 years of age and older. °Monday - Tuesday (8am-5pm) °Most Wednesdays (8:30-5pm) °$30 per visit, cash only  °[facility] ° [address], [place] (336) 641-4533 Patients are seen by appointment only. Walk-ins are not accepted. [facility] will see patients 18 years of age and older. °One Wednesday Evening (Monthly: Volunteer Based).  $30 per visit, cash only  °[facility]  (919) 537-3737 for adults; Children under age 4, call Graduate Pediatric Dentistry at (919) [phone]. Children aged 4-14, please call (919) [phone] to request a pediatric application. ° Dental services are provided in all areas of dental care including fillings, crowns and bridges, complete and partial dentures, implants, gum treatment, root canals, and extractions. Preventive care is also provided. Treatment is provided to both adults and children. °Patients are selected via a  lottery and there is often a waiting list. °  °[facility] [address], °[place] ° (336) 763-8833 [email or web address] °  °[facility] [address], [place], [place] (336)723-1848, Ext. 123 Second and Fourth Thursday of each month, opens at 6:30 AM; Clinic ends at 9 AM.  Patients are seen on a first-come first-served basis, and a limited number are seen during each clinic.  ° °[facility] ° [address], [place], [place] (336) 723-7904   Eligibility Requirements °You must have lived in [place], [place], or [place] counties for at least the last three months. °  You cannot be eligible for state or federal sponsored healthcare insurance, including Veterans Administration, Medicaid, or Medicare. °  You generally cannot be eligible for healthcare insurance through your employer.  °  How to apply: °Eligibility screenings are held every Tuesday and Wednesday afternoon from 1:00 pm until 4:00 pm. You do not need an appointment for the interview!  °[facility] [address], [place], [place] 336-631-2330   °[facility] [facility]  336-342-8273   °[facility]  336-703-3100   °[facility]  336-570-6415   ° °Behavioral Health Resources in the Community: °Intensive Outpatient Programs °Organization         Address  Phone  Notes  °[facility] [address], [place], [place] 336-878-6098   °[facility] [address], [place], [place] 336-832-9800   °[facility] [address], [place], [place] ° 336-882-2125   °[facility] [address],  °[place],  1-800-853-5163 or 336-641-4981   °Substance Abuse Resources °Organization         Address  Phone  Notes  °[facility]  336-882-2125   °[facility]  336-784-9470   °[facility]  336-285-9073   °[facility]  336-845-3988   °Residential &  Outpatient Substance Abuse Program  1-800-659-3381   °Psychological Services °Organization         Address  Phone  Notes  ° Health  336- 832-9600   °[facility]  336- 378-7881   °[facility] [address], [place] 1-800-853-5163 or 336-641-4981   ° °Mobile Crisis Teams °Organization         Address  Phone  Notes  °[facility], Mobile Crisis Care Unit  1-877-626-1772   °Assertive °Psychotherapeutic   Services ° [address] [place], [place] 336-834-9664   °Sharon DeEsch [address] °[place] 336-554-5454   ° °Self-Help/Support Groups °Organization         Address  Phone             Notes  °[facility] [place] - variety of support groups  336- 373-1402 Call for more information  °Narcotics Anonymous (NA), [facility] [address], °[place]  2 meetings at this location  ° °Residential Treatment Programs °Organization         Address  Phone  Notes  °[facility] [address],    °[place]  1-866-801-8205   °[facility] ° [address], [place], [place] 704-293-8524   °[facility] [address], [place] 336-845-3988 Admissions: 8am-3pm M-F  °[facility] [address]    °[place], [place] 336-841-1104   °[facility] [address], [place], [place] 336-379-7146   °[facility] [address]  °[place], [place] 336-285-9073   °[facility] Intensive Outpatient [address], [place], [place] 336-852-3033   °[facility] [address]  °[place], [place] 1-877-615-2722 or 336-784-9470   °[facility] [address] [place], [place] 336-227-7417 Accepts Medicaid  °[facility] [address]  °[place] 1-800-659-3381 Substance Abuse/Addiction Treatment  ° °[place] Behavioral Health Resources °Organization          Address  Phone  Notes  °[facility]  (888) 581-9988   °Julie Brannon, PhD [address] [place], [place]   (336) 349-5553 or (336) 951-0000   °[facility]   [address] °[place], [place] (336) 349-4454   °[facility] [address], [place], [place] (336) 342-8316 Insurance/Medicaid/sponsorship through [facility]  °[facility] [address]                                    [place], [place] (336) 342-8316 Therapy/tele-psych/case  °[facility] [address]  ° [place], [place] (336) 349-2233    °Dr. Arfeen  (336) 349-4544   °[facility] [place]  [employer] [place] [facility] 1) [address], [place] °2) [address], [place] °3)  [address], [place] (336) 349-3220 °(336) 342-7768 ° °(336) 342-8140   °[place] [facility] (336) 342-1394 or (336) 342-3537 (After Hours)    ° ° ° °

## 2015-05-11 NOTE — ED Provider Notes (Signed)
CSN: [id number]     Arrival date & time 05/11/15  1745 History   First MD Initiated Contact with Patient 05/11/15 1749     Chief Complaint  Patient presents with  . Abdominal Pain     (Consider location/radiation/quality/duration/timing/severity/associated sxs/prior Treatment) HPI   Blood pressure 151/91, pulse 86, temperature 98.3 F (36.8 C), temperature source Oral, resp. rate 18, height 5' (1.524 m), weight 130 lb (58.968 kg), last menstrual period 04/29/2015, SpO2 100 %.  Carolyn BologneseBrittanie R Logsdon is a 29 y.o. female with no significant medical history presents today with abdominal pain. She states that the pain has been present for months but got worse earlier today and is located in the right lower quadrant. The pain is constant and dull but was more severe today. Denies radiation or alleviating or aggravating factors. She reports some associated nausea and blood in the stool. She states that she has noticed blood in the stool and in the toilet after having a bowel movement. Reports constipation this week and increased stool frequency a few weeks ago. LMP was 04/29/15. Denies dysuria, hematuria, vaginal itching, discharge, fever, chills, SOB, chest pain, or cough. She states that she has been feeling slightly light headed in the morning when she wakes up.    History reviewed. No pertinent past medical history. Past Surgical History  Procedure Laterality Date  . Cesarean section     No family history on file. Social History  Substance Use Topics  . Smoking status: Never Smoker   . Smokeless tobacco: None  . Alcohol Use: No   OB History    No data available     Review of Systems  10 systems reviewed and found to be negative, except as noted in the HPI.   Allergies  Review of patient's allergies indicates no known allergies.  Home Medications   Prior to Admission medications   Medication Sig Start Date End Date Taking? Authorizing Provider  cyclobenzaprine (FLEXERIL) 5 MG  tablet Take 1 tablet (5 mg total) by mouth at bedtime as needed for muscle spasms. 12/14/14   Rodolph BongEvan S Corey, MD  naproxen (NAPROSYN) 375 MG tablet Take 1 tablet (375 mg total) by mouth 2 (two) times daily. 12/14/14   Rodolph BongEvan S Corey, MD   BP 144/99 mmHg  Pulse 95  Temp(Src) 98.1 F (36.7 C) (Oral)  Resp 18  Ht 5' (1.524 m)  Wt 130 lb (58.968 kg)  BMI 25.39 kg/m2  SpO2 99%  LMP 04/29/2015 Physical Exam  Constitutional: She is oriented to person, place, and time. She appears well-developed and well-nourished. No distress.  HENT:  Head: Normocephalic.  Eyes: Conjunctivae and EOM are normal.  Cardiovascular: Normal rate.   Pulmonary/Chest: Effort normal and breath sounds normal. No stridor. No respiratory distress. She has no wheezes. She has no rales. She exhibits no tenderness.  Abdominal: Soft. Bowel sounds are normal. She exhibits no distension and no mass. There is no tenderness. There is no rebound and no guarding.    Murphy sign negative, no tenderness to palpation over McBurney's point, Rovsings, Psoas and obturator all negative.   Genitourinary:  Supervised PA student [facility] who performs pelvic exam: No rashes or lesion, no abnormal vaginal discharge, no cervical motion or adnexal tenderness.  Musculoskeletal: Normal range of motion. She exhibits no edema or tenderness.  Neurological: She is alert and oriented to person, place, and time.  Psychiatric: She has a normal mood and affect.  Nursing note and vitals reviewed.   ED Course  Procedures (including critical care time) Labs Review Labs Reviewed  URINALYSIS, ROUTINE W REFLEX MICROSCOPIC (NOT AT [facility])  PREGNANCY, URINE    Imaging Review No results found. I have personally reviewed and evaluated these images and lab results as part of my medical decision-making.   EKG Interpretation None      MDM   Final diagnoses:  Chronic RLQ pain    Filed Vitals:   05/11/15 1750  BP: 144/99  Pulse: 95  Temp: 98.1  F (36.7 C)  TempSrc: Oral  Resp: 18  Height: 5' (1.524 m)  Weight: 130 lb (58.968 kg)  SpO2: 99%    Carolyn House is 29 y.o. female presenting with exacerbation of right lower quadrant pain which she's had for several months. Abdominal exam benign. No systemic signs of infection, no GI or GU complaints. Pelvic exam unremarkable, blood work, wrapped prep, urine unremarkable. Think this may be related to ovarian cysts. Patient given referral to women's hospital. Extensive discussion of return precautions.  Evaluation does not show pathology that would require ongoing emergent intervention or inpatient treatment. Pt is hemodynamically stable and mentating appropriately. Discussed findings and plan with patient/guardian, who agrees with care plan. All questions answered. Return precautions discussed and outpatient follow up given.         Wynetta Emery, PA-C 05/11/15 2042  Geoffery Lyons, MD 05/11/15 [phone]

## 2015-05-11 NOTE — ED Notes (Signed)
Pain in her abdominal for a few months but worse yesterday and today.

## 2015-05-12 LAB — GC/CHLAMYDIA PROBE AMP (~~LOC~~) NOT AT ARMC
Chlamydia: NEGATIVE
Neisseria Gonorrhea: NEGATIVE

## 2023-04-14 ENCOUNTER — Encounter: Payer: Self-pay | Admitting: Emergency Medicine

## 2023-04-14 ENCOUNTER — Ambulatory Visit
Admission: EM | Admit: 2023-04-14 | Discharge: 2023-04-14 | Disposition: A | Discharge status: Home / Self Care | Payer: Self-pay | Attending: Emergency Medicine | Admitting: Emergency Medicine

## 2023-04-14 DIAGNOSIS — H00014 Hordeolum externum left upper eyelid: Secondary | ICD-10-CM

## 2023-04-14 MED ORDER — ERYTHROMYCIN 5 MG/GM OP OINT: TOPICAL_OINTMENT | OPHTHALMIC | 0 refills | Status: AC

## 2023-04-14 NOTE — ED Triage Notes (Signed)
Pt presents with her left eye lid swollen since yesterday.

## 2023-04-14 NOTE — Discharge Instructions (Signed)
Today you being treated for a stye, more information is in your packet about this condition, typically will fix itself over the course of 2 to 3 weeks.   Erythromycin ointment along the top borderline every morning and every evening for 7 days to clear any Derm contributing to your system. If the other eye starts to have symptoms you may use medication in it as well. Do not allow tip of dropper to touch eye.  May hold warm compresses to the affected area in 10 to 15-minute intervals, this helps with comfort, area may drain which is okay  Do not rub eyes, this may cause more irritation.  May use routine, Zyrtec or benadryl as needed to help if itching present.  Please avoid use of eye makeup until symptoms clear.  If your stye continues to enlarge or starts to impair your vision please reach out to your eye doctor or eye doctor that is listed on paperwork for further evaluation and management as they are the only person who can drain the stye due to the placement

## 2023-04-15 NOTE — ED Provider Notes (Signed)
Renaldo Fiddler    CSN: [id number] Arrival date & time: 04/14/23  1655      History   Chief Complaint Chief Complaint  Patient presents with   Belepharitis    HPI Carolyn House is a 37 y.o. female.   Patient presents for evaluation of left eyelid swelling beginning 1 day ago.  Has sensation of " grit" in the eye.  Has not attempted treatment.  Denies pain, pruritus, redness, drainage.  Denies use of contacts but does wear glasses.  Denies injury or trauma to the eye.  History reviewed. No pertinent past medical history.  There are no problems to display for this patient.   Past Surgical History:  Procedure Laterality Date   CESAREAN SECTION      OB History   No obstetric history on file.      Home Medications    Prior to Admission medications   Medication Sig Start Date End Date Taking? Authorizing Provider  erythromycin ophthalmic ointment Place a 1/2 inch ribbon of ointment into the lower eyelid. 04/14/23  Yes Euretha Najarro R, NP  cyclobenzaprine (FLEXERIL) 5 MG tablet Take 1 tablet (5 mg total) by mouth at bedtime as needed for muscle spasms. 12/14/14   Rodolph Bong, MD  naproxen (NAPROSYN) 375 MG tablet Take 1 tablet (375 mg total) by mouth 2 (two) times daily. 12/14/14   Rodolph Bong, MD    Family History No family history on file.  Social History Social History   Tobacco Use   Smoking status: Never   Smokeless tobacco: Never  Vaping Use   Vaping status: Never Used  Substance Use Topics   Alcohol use: No   Drug use: No     Allergies   Patient has no known allergies.   Review of Systems Review of Systems   Physical Exam Triage Vital Signs ED Triage Vitals  Encounter Vitals Group     BP 04/14/23 1741 127/88     Systolic BP Percentile --      Diastolic BP Percentile --      Pulse Rate 04/14/23 1741 92     Resp 04/14/23 1741 18     Temp 04/14/23 1741 98.9 F (37.2 C)     Temp Source 04/14/23 1741 Oral     SpO2 04/14/23 1741  99 %     Weight --      Height --      Head Circumference --      Peak Flow --      Pain Score 04/14/23 1740 1     Pain Loc --      Pain Education --      Exclude from Growth Chart --    No data found.  Updated Vital Signs BP 127/88 (BP Location: Left Arm)   Pulse 92   Temp 98.9 F (37.2 C) (Oral)   Resp 18   LMP  (LMP Unknown)   SpO2 99%   Visual Acuity Right Eye Distance:   Left Eye Distance:   Bilateral Distance:    Right Eye Near:   Left Eye Near:    Bilateral Near:     Physical Exam Constitutional:      Appearance: Normal appearance.  Eyes:     Comments: Stye present to the left upper eyelid with mild to moderate eyelid swelling, no abnormality to the conjunctiva, sclera, cornea or iris, vision is grossly intact, extraocular movements intact  Pulmonary:     Effort: Pulmonary effort is  normal.  Neurological:     Mental Status: She is alert and oriented to person, place, and time. Mental status is at baseline.      UC Treatments / Results  Labs (all labs ordered are listed, but only abnormal results are displayed) Labs Reviewed - No data to display  EKG   Radiology No results found.  Procedures Procedures (including critical care time)  Medications Ordered in UC Medications - No data to display  Initial Impression / Assessment and Plan / UC Course  I have reviewed the triage vital signs and the nursing notes.  Pertinent labs & imaging results that were available during my care of the patient were reviewed by me and considered in my medical decision making (see chart for details).  Hordeolum externum of left upper eyelid  Presentation consistent with above diagnosis, discussed with patient, discussed timeline of possible resolution, erythromycin ointment sent to pharmacy, advised against direct eye touching or rubbing to prevent further contamination and spread, may use warm compresses to the affected area, advised to monitor and if symptoms  continue to persist past 2 to 3 weeks or worsen at any point she is to follow-up with her ophthalmologist, also given walking referral if she is unable to be seen by her doctor Final Clinical Impressions(s) / UC Diagnoses   Final diagnoses:  Hordeolum externum of left upper eyelid     Discharge Instructions      Today you being treated for a stye, more information is in your packet about this condition, typically will fix itself over the course of 2 to 3 weeks.   Erythromycin ointment along the top borderline every morning and every evening for 7 days to clear any Derm contributing to your system. If the other eye starts to have symptoms you may use medication in it as well. Do not allow tip of dropper to touch eye.  May hold warm compresses to the affected area in 10 to 15-minute intervals, this helps with comfort, area may drain which is okay  Do not rub eyes, this may cause more irritation.  May use routine, Zyrtec or benadryl as needed to help if itching present.  Please avoid use of eye makeup until symptoms clear.  If your stye continues to enlarge or starts to impair your vision please reach out to your eye doctor or eye doctor that is listed on paperwork for further evaluation and management as they are the only person who can drain the stye due to the placement   ED Prescriptions     Medication Sig Dispense Auth. Provider   erythromycin ophthalmic ointment Place a 1/2 inch ribbon of ointment into the lower eyelid. 3.5 g Valinda Hoar, NP      PDMP not reviewed this encounter.   Valinda Hoar, [place] 04/15/23 [phone]

## 2023-07-18 ENCOUNTER — Ambulatory Visit: Payer: Self-pay | Admitting: Nurse Practitioner

## 2023-07-24 ENCOUNTER — Ambulatory Visit: Payer: Self-pay | Admitting: Family Medicine

## 2023-07-28 ENCOUNTER — Ambulatory Visit: Payer: Self-pay | Admitting: Family Medicine

## 2023-09-01 ENCOUNTER — Encounter: Payer: Self-pay | Admitting: Obstetrics & Gynecology

## 2023-11-14 ENCOUNTER — Encounter (HOSPITAL_COMMUNITY): Payer: Self-pay | Admitting: Emergency Medicine

## 2023-11-14 ENCOUNTER — Emergency Department (HOSPITAL_COMMUNITY)
Admission: EM | Admit: 2023-11-14 | Discharge: 2023-11-14 | Discharge status: Against Medical Advice | Payer: Self-pay | Attending: Emergency Medicine | Admitting: Emergency Medicine

## 2023-11-14 ENCOUNTER — Other Ambulatory Visit: Payer: Self-pay

## 2023-11-14 DIAGNOSIS — R072 Precordial pain: Secondary | ICD-10-CM | POA: Insufficient documentation

## 2023-11-14 DIAGNOSIS — Z5321 Procedure and treatment not carried out due to patient leaving prior to being seen by health care provider: Secondary | ICD-10-CM | POA: Insufficient documentation

## 2023-11-14 LAB — CBC
HCT: 38.1 % (ref 36.0–46.0)
Hemoglobin: 12.7 g/dL (ref 12.0–15.0)
MCH: 30 pg (ref 26.0–34.0)
MCHC: 33.3 g/dL (ref 30.0–36.0)
MCV: 89.9 fL (ref 80.0–100.0)
Platelets: 347 10*3/uL (ref 150–400)
RBC: 4.24 MIL/uL (ref 3.87–5.11)
RDW: 13.2 % (ref 11.5–15.5)
WBC: 5.6 10*3/uL (ref 4.0–10.5)
nRBC: 0 % (ref 0.0–0.2)

## 2023-11-14 LAB — BASIC METABOLIC PANEL WITH GFR
Anion gap: 13 (ref 5–15)
BUN: 9 mg/dL (ref 6–20)
CO2: 20 mmol/L — ABNORMAL LOW (ref 22–32)
Calcium: 9.3 mg/dL (ref 8.9–10.3)
Chloride: 103 mmol/L (ref 98–111)
Creatinine, Ser: 0.83 mg/dL (ref 0.44–1.00)
GFR, Estimated: >60 mL/min (ref >60)
Glucose, Bld: 96 mg/dL (ref 70–99)
Potassium: 3.8 mmol/L (ref 3.5–5.1)
Sodium: 136 mmol/L (ref 135–145)

## 2023-11-14 LAB — HCG, SERUM, QUALITATIVE: Preg, Serum: NEGATIVE

## 2023-11-14 LAB — TROPONIN I (HIGH SENSITIVITY)
Troponin I (High Sensitivity): <2 ng/L (ref <18)
Troponin I (High Sensitivity): 3 ng/L (ref <18)

## 2023-11-14 LAB — LIPASE, BLOOD: Lipase: 46 U/L (ref 11–51)

## 2023-11-14 NOTE — ED Triage Notes (Signed)
 Pt reports substernal chest pain described as burning that moves to her back.  No other symptoms reported.

## 2023-11-14 NOTE — ED Notes (Addendum)
 Pt called for vitals, no answer. Pt left and did not advise staff. Taking pt OTF.
